# Patient Record
Sex: Male | Born: 2000
Health system: Southern US, Community
[De-identification: ages and names within clinical notes are randomized; demographics above are authoritative.]

---
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
## 2023-02-23 ENCOUNTER — Other Ambulatory Visit: Payer: Self-pay

## 2023-02-23 ENCOUNTER — Ambulatory Visit (INDEPENDENT_AMBULATORY_CARE_PROVIDER_SITE_OTHER): Payer: Self-pay | Admitting: Family Medicine

## 2023-02-23 ENCOUNTER — Encounter: Payer: Self-pay | Admitting: Family Medicine

## 2023-02-23 VITALS — BP 124/84 | Ht 71.0 in | Wt 190.0 lb

## 2023-02-23 DIAGNOSIS — S46911A Strain of unspecified muscle, fascia and tendon at shoulder and upper arm level, right arm, initial encounter: Secondary | ICD-10-CM

## 2023-02-23 DIAGNOSIS — M25511 Pain in right shoulder: Secondary | ICD-10-CM

## 2023-02-23 NOTE — Progress Notes (Addendum)
PCP: No primary care provider on file.  Chief Complaint: Right AC joint sprain Subjective:   HPI: Patient is a 22 y.o. male here for follow up after being diagnosed with [facility] joint sprain. Patient was skiing last week at [facility] out of state, patient states that he fell on his right shoulder and rolled down the hill.  Patient states that he subsequently had pain in his right shoulder.  Patient [place] and had an x-ray.  Patient states that he was told he had a grade 1or2 AC joint separation.  Patient was given a sling and has been wearing the sling ever since.  Patient notes that he has had significant improvement over the last few days of his pain as well as his range of motion.  Patient has no other concerns at this time.  History reviewed. No pertinent past medical history.  Current Outpatient Medications on File Prior to Visit  Medication Sig Dispense Refill   hyoscyamine (LEVSIN SL) 0.125 MG SL tablet 1-2 tablets under the tongue as needed for pain 30 tablet 0   minocycline (MINOCIN,DYNACIN) 100 MG capsule      No current facility-administered medications on file prior to visit.    History reviewed. No pertinent surgical history.  No Known Allergies  BP 124/84   Ht 5\' 11"  (1.803 m)   Wt 190 lb (86.2 kg)   BMI 26.50 kg/m       No data to display              No data to display              Objective:  Physical Exam:  Gen: NAD, comfortable in exam room  Right Shoulder: Inspection reveals some swelling of the AC joint on the right shoulder compared to the left.  There is no obvious abnormality/angulation or deformity of the [facility] joint noted.  There is tenderness to palpation over the [facility] joint itself, no other tenderness to palpation noted throughout the shoulder.  Range of motion is decreased but patient does have good internal and external rotation with some minimal abduction, patient cannot AB duct against resistance.  Strength is  decreased due to pain with abduction, strength is preserved with internal/external rotation.  U/S AC joint: Ultrasound of the AC joint does not show any significant effusion or geyser sign, there is some hypoechoic changes likely consistent with mild inflammation/fluid.  The acromion and the clavicular itself do not appear to be significantly displaced.  There does not appear to be any fractures of either the acromion or the clavicle at its distal either ends.  There appears to be some mild calcification in the joint itself but nothing significant.  Impression: AC joint with some mild hypoechoic changes consistent with some fluid, no signs of any fracture or significant displacement of the AC joint.   Assessment & Plan:  1. 1. Right shoulder pain, unspecified chronicity (Primary) - Patient was able to show [place] his x-rays via photo on his phone, initial x-rays do appear to show likely grade 2 AC joint strain.  Ultrasound also confirms this, there is no significant concern for greater than level 3 strain.  At this time patient is still wearing the sling but he does have increased range of motion as well as decreased pain, advised patient that he may remove the sling and we will go ahead and start getting him in physical therapy.  Patient was advised to try and do  2 sessions of physical therapy prior to going back to school, if any other concerns patient to follow-up with [place]. - [place] LIMITED JOINT SPACE STRUCTURES UP RIGHT; Future - Ambulatory referral to Physical Therapy  2. Strain of acromioclavicular joint, right, initial encounter See A/P for Rt shoulder pain above    Brenton Grills MD, PGY-4  Sports Medicine Fellow [facility]  Addendum:  Patient seen and examined in the office with fellow.   History, exam, plan of care were precepted with me.  Agree with findings as documented in fellow note.  Darene Lamer, DO, CAQSM

## 2023-03-09 ENCOUNTER — Encounter (HOSPITAL_BASED_OUTPATIENT_CLINIC_OR_DEPARTMENT_OTHER): Payer: Self-pay | Admitting: Physical Therapy

## 2023-03-09 ENCOUNTER — Other Ambulatory Visit: Payer: Self-pay

## 2023-03-09 ENCOUNTER — Ambulatory Visit (HOSPITAL_BASED_OUTPATIENT_CLINIC_OR_DEPARTMENT_OTHER): Payer: 59 | Attending: Family Medicine | Admitting: Physical Therapy

## 2023-03-09 DIAGNOSIS — M25511 Pain in right shoulder: Secondary | ICD-10-CM | POA: Diagnosis present

## 2023-03-09 NOTE — Therapy (Signed)
 OUTPATIENT PHYSICAL THERAPY UPPER EXTREMITY EVALUATION   Patient Name: Jorge Hernandez MRN: [id number] DOB:February 19, 2001, 23 y.o., male Today's Date: 03/09/2023  END OF SESSION:  PT End of Session - 03/09/23 1203     Visit Number 1    Number of Visits 4    Date for PT Re-Evaluation 04/06/23    PT Start Time 0845    PT Stop Time 0926    PT Time Calculation (min) 41 min    Activity Tolerance Patient tolerated treatment well    Behavior During Therapy [name] for tasks assessed/performed             History reviewed. No pertinent past medical history. History reviewed. No pertinent surgical history. There are no active problems to display for this patient.   PCP: None   REFERRING PROVIDER: Reyne Bustle MD   REFERRING DIAG: Diagnosis  M25.511 (ICD-10-CM) - Right shoulder pain, unspecified chronicity     THERAPY DIAG:  Acute pain of right shoulder  Rationale for Evaluation and Treatment: Rehabilitation  ONSET DATE:   SUBJECTIVE:                                                                                                                                                                                      SUBJECTIVE STATEMENT: Patient was skiing 2 weeks prior when he fell injuring his right shoulder.  He is found to have a AC joint sprain.  He reports a progressive improvement over the past 2 weeks.  He only feels pain at end ranges at this time.  He has no prior history of shoulder issues.  He was active and like to workout prior to his fall. Hand dominance: Right  PERTINENT HISTORY: No significant history.  PAIN:  Are you having pain? Yes: NPRS scale: 0/10 5-6/10  Pain location: right shoulder  Pain description: aching  Aggravating factors: use of the arm  Relieving factors: rest   PRECAUTIONS: None  RED FLAGS: None   WEIGHT BEARING RESTRICTIONS: No  FALLS:  Has patient fallen in last 6 months? No  LIVING ENVIRONMENT: Nothing pertinent   OCCUPATION: Student   Hobbies: lifts weights   PLOF: Independent  PATIENT GOALS:  To return to full function   NEXT MD VISIT:    OBJECTIVE:  Note: Objective measures were completed at Evaluation unless otherwise noted.  DIAGNOSTIC FINDINGS:    PATIENT SURVEYS :  FOTO    COGNITION: Overall cognitive status: Within functional limits for tasks assessed     SENSATION: Denies paresthesias   POSTURE: Good   UPPER EXTREMITY ROM:   Active ROM Right eval Left eval  Shoulder flexion Full pain free  Shoulder extension    Shoulder abduction    Shoulder adduction    Shoulder internal rotation Pain at end range    Shoulder external rotation Full pain free    Elbow flexion    Elbow extension    Wrist flexion    Wrist extension    Wrist ulnar deviation    Wrist radial deviation    Wrist pronation    Wrist supination    (Blank rows = not tested)  UPPER EXTREMITY MMT:  MMT Right eval Left eval  Shoulder flexion 12.4 16.4  Shoulder extension    Shoulder abduction    Shoulder adduction    Shoulder internal rotation 21.8 23.5  Shoulder external rotation 12.9 20.0  Middle trapezius    Lower trapezius    Elbow flexion    Elbow extension    Wrist flexion    Wrist extension    Wrist ulnar deviation    Wrist radial deviation    Wrist pronation    Wrist supination    Grip strength (lbs)    (Blank rows = not tested)    PALPATION:  No TTP at this time                                                                                                                             TREATMENT DATE:  Access Code: [email or web address] URL: [email or web address] Date: 03/09/2023 Prepared by: Alm Don  Exercises - Supine Shoulder Alphabet  - 1 x daily - 7 x weekly - 3 sets - 10 reps - Shoulder External Rotation with Anchored Resistance  - 1 x daily - 7 x weekly - 3 sets - 10 reps - Shoulder extension with resistance - Neutral  - 1 x daily - 7 x weekly - 3  sets - 10 reps - Scapular Retraction with Resistance  - 1 x daily - 7 x weekly - 3 sets - 10 reps - Standing Shoulder Internal Rotation with Anchored Resistance  - 1 x daily - 7 x weekly - 3 sets - 10 reps  PATIENT EDUCATION: Education details: HEP, symptom management, progression of exercises, use of RPE. Person educated: Patient Education method: Explanation, Demonstration, Tactile cues, Verbal cues, and Handouts Education comprehension: verbalized understanding, returned demonstration, verbal cues required, tactile cues required, and needs further education  HOME EXERCISE PROGRAM: Access Code: [email or web address] URL: [email or web address] Date: 03/09/2023 Prepared by: Alm Don  Exercises - Supine Shoulder Alphabet  - 1 x daily - 7 x weekly - 3 sets - 10 reps - Shoulder External Rotation with Anchored Resistance  - 1 x daily - 7 x weekly - 3 sets - 10 reps - Shoulder extension with resistance - Neutral  - 1 x daily - 7 x weekly - 3 sets - 10 reps - Scapular Retraction with Resistance  - 1 x daily - 7 x weekly - 3 sets - 10 reps - Standing Shoulder Internal Rotation with Anchored Resistance  - 1 x daily -  7 x weekly - 3 sets - 10 reps  ASSESSMENT:  CLINICAL IMPRESSION: Patient is a 23 year old male status post fall and subsequent right AC joint sprain approximately 2 weeks ago.  He presents with mild weakness and pain with endrange internal rotation.  He feels like he is been progressing over the past week or 2.  He returns to school on 1/13.  He hopes to return to weightlifting at some point.  He also rubs return to skiing.  He would benefit from skilled therapy to improve endrange IR and equalize strength left to right.  He is right-hand dominant.  Patient was given a progressive exercise program.  He was advised to follow the symptoms but progress as tolerated.  OBJECTIVE IMPAIRMENTS: decreased strength, impaired UE functional use, and pain.   ACTIVITY LIMITATIONS:  carrying, lifting, and reach over head  PARTICIPATION LIMITATIONS: cleaning, laundry, shopping, community activity, and exercise  PERSONAL FACTORS: None  REHAB POTENTIAL: Excellent  CLINICAL DECISION MAKING: Stable/uncomplicated  EVALUATION COMPLEXITY: Low  GOALS: Goals reviewed with patient? Yes    Short-term goals equal long-term goals   LONG TERM GOALS: Target date: 04/20/2023    Patient will demonstrate equal strength left to right in order to perform ADLs Baseline:  Goal status: INITIAL  2.  Patient will return to the gym without pain Baseline:  Goal status: INITIAL  3.  Patient will perform full internal rotation without pain in order to perform ADLs Baseline:  Goal status: INITIAL  4.  Patient will perform school tasks without increased right shoulder pain Baseline:  Goal status: INITIAL PLAN: PT FREQUENCY: 1x/week  PT DURATION: 4 weeks  PLANNED INTERVENTIONS: 97110-Therapeutic exercises, 97530- Therapeutic activity, [id number]- Neuromuscular re-education, 97535- Self Care, [id number]- Manual therapy, [id number]- Electrical stimulation (unattended), 97035- Ultrasound, Patient/Family education,  Taping, Dry Needling, DME instructions, Cryotherapy, and Moist heat   PLAN FOR NEXT SESSION: Assess tolerance to exercises.  If patient does well advance exercises using RPE for guide.  Consider moving back into light gym exercises such as rows LAT pull downs and bicep curls.  Avoid bench pressing and anything behind the head at this point.  If the patient is sore consider manual therapy.   Alm JINNY Don, PT 03/09/2023, 12:09 PM

## 2023-03-14 ENCOUNTER — Encounter (HOSPITAL_BASED_OUTPATIENT_CLINIC_OR_DEPARTMENT_OTHER): Payer: Self-pay | Admitting: Physical Therapy

## 2023-03-14 ENCOUNTER — Ambulatory Visit (HOSPITAL_BASED_OUTPATIENT_CLINIC_OR_DEPARTMENT_OTHER): Payer: 59 | Admitting: Physical Therapy

## 2023-03-14 DIAGNOSIS — M25511 Pain in right shoulder: Secondary | ICD-10-CM

## 2023-03-14 NOTE — Therapy (Signed)
 OUTPATIENT PHYSICAL THERAPY UPPER EXTREMITY EVALUATION   Patient Name: Jorge Hernandez MRN: [id number] DOB:2001/01/08, 23 y.o., male Today's Date: 03/14/2023  END OF SESSION:  PT End of Session - 03/14/23 1305     Visit Number 2    Number of Visits 4    Date for PT Re-Evaluation 04/20/23    PT Start Time 1300    PT Stop Time 1343    PT Time Calculation (min) 43 min    Activity Tolerance Patient tolerated treatment well    Behavior During Therapy [facility] for tasks assessed/performed             History reviewed. No pertinent past medical history. History reviewed. No pertinent surgical history. There are no active problems to display for this patient.   PCP: None   REFERRING PROVIDER: Reyne Bustle MD   REFERRING DIAG: Diagnosis  M25.511 (ICD-10-CM) - Right shoulder pain, unspecified chronicity     THERAPY DIAG:  Acute pain of right shoulder  Rationale for Evaluation and Treatment: Rehabilitation  ONSET DATE:   SUBJECTIVE:                                                                                                                                                                                      SUBJECTIVE STATEMENT: The patient reported no significant pain after his last visit. There exercises have been going well.   Eval: Patient was skiing 2 weeks prior when he fell injuring his right shoulder.  He is found to have a AC joint sprain.  He reports a progressive improvement over the past 2 weeks.  He only feels pain at end ranges at this time.  He has no prior history of shoulder issues.  He was active and like to workout prior to his fall. Hand dominance: Right  PERTINENT HISTORY: No significant history.  PAIN:  Are you having pain? Yes: NPRS scale: 0/10 5-6/10  Pain location: right shoulder  Pain description: aching  Aggravating factors: use of the arm  Relieving factors: rest   PRECAUTIONS: None  RED FLAGS: None   WEIGHT BEARING RESTRICTIONS:  No  FALLS:  Has patient fallen in last 6 months? No  LIVING ENVIRONMENT: Nothing pertinent  OCCUPATION: Student   Hobbies: lifts weights   PLOF: Independent  PATIENT GOALS:  To return to full function   NEXT MD VISIT:    OBJECTIVE:  Note: Objective measures were completed at Evaluation unless otherwise noted.  DIAGNOSTIC FINDINGS:    PATIENT SURVEYS :  FOTO    COGNITION: Overall cognitive status: Within functional limits for tasks assessed     SENSATION: Denies paresthesias   POSTURE: Good  UPPER EXTREMITY ROM:   Active ROM Right eval Left eval  Shoulder flexion Full pain free    Shoulder extension    Shoulder abduction    Shoulder adduction    Shoulder internal rotation Pain at end range    Shoulder external rotation Full pain free    Elbow flexion    Elbow extension    Wrist flexion    Wrist extension    Wrist ulnar deviation    Wrist radial deviation    Wrist pronation    Wrist supination    (Blank rows = not tested)  UPPER EXTREMITY MMT:  MMT Right eval Left eval  Shoulder flexion 12.4 16.4  Shoulder extension    Shoulder abduction    Shoulder adduction    Shoulder internal rotation 21.8 23.5  Shoulder external rotation 12.9 20.0  Middle trapezius    Lower trapezius    Elbow flexion    Elbow extension    Wrist flexion    Wrist extension    Wrist ulnar deviation    Wrist radial deviation    Wrist pronation    Wrist supination    Grip strength (lbs)    (Blank rows = not tested)    PALPATION:  No TTP at this time                                                                                                                             TREATMENT DATE:  1/8   Wand flexion for stretch x15 wand  Supine ABC 3 lbs   Standing scaption 2lbs 2x10  Standing Flexion 2x10 2 lbs  Both in mirror   Cable:  Row 2x12 15 lbs  Lat pull down 2x12 15 lbs  Tricpes split rope 2x12 15 lbs  Bicpes curl 2x12 8 lbs        Eval: Access Code: [email or web address] URL: [email or web address] Date: 03/09/2023 Prepared by: Alm Don  Exercises - Supine Shoulder Alphabet  - 1 x daily - 7 x weekly - 3 sets - 10 reps - Shoulder External Rotation with Anchored Resistance  - 1 x daily - 7 x weekly - 3 sets - 10 reps - Shoulder extension with resistance - Neutral  - 1 x daily - 7 x weekly - 3 sets - 10 reps - Scapular Retraction with Resistance  - 1 x daily - 7 x weekly - 3 sets - 10 reps - Standing Shoulder Internal Rotation with Anchored Resistance  - 1 x daily - 7 x weekly - 3 sets - 10 reps  PATIENT EDUCATION: Education details: HEP, symptom management, progression of exercises, use of RPE. Person educated: Patient Education method: Explanation, Demonstration, Tactile cues, Verbal cues, and Handouts Education comprehension: verbalized understanding, returned demonstration, verbal cues required, tactile cues required, and needs further education  HOME EXERCISE PROGRAM: Access Code: [email or web address] URL: [email or web address] Date: 03/09/2023 Prepared by: Alm Don  Exercises - Supine Shoulder Alphabet  - 1 x daily - 7  x weekly - 3 sets - 10 reps - Shoulder External Rotation with Anchored Resistance  - 1 x daily - 7 x weekly - 3 sets - 10 reps - Shoulder extension with resistance - Neutral  - 1 x daily - 7 x weekly - 3 sets - 10 reps - Scapular Retraction with Resistance  - 1 x daily - 7 x weekly - 3 sets - 10 reps - Standing Shoulder Internal Rotation with Anchored Resistance  - 1 x daily - 7 x weekly - 3 sets - 10 reps  ASSESSMENT:  CLINICAL IMPRESSION: Therapy updated patients HEP. He was given standing exercises to work on. We updated his HEP We reviewed how to use RPE to grade his exercises. He  was advised to progress his weights as tolerated. He had no pain. He will be going back to school. We will likely D/C to HEP. We also reviewed progression back into benching but he was  advised to give it a few weeks.  Eval: Patient is a 23 year old male status post fall and subsequent right AC joint sprain approximately 2 weeks ago.  He presents with mild weakness and pain with endrange internal rotation.  He feels like he is been progressing over the past week or 2.  He returns to school on 1/13.  He hopes to return to weightlifting at some point.  He also rubs return to skiing.  He would benefit from skilled therapy to improve endrange IR and equalize strength left to right.  He is right-hand dominant.  Patient was given a progressive exercise program.  He was advised to follow the symptoms but progress as tolerated.  OBJECTIVE IMPAIRMENTS: decreased strength, impaired UE functional use, and pain.   ACTIVITY LIMITATIONS: carrying, lifting, and reach over head  PARTICIPATION LIMITATIONS: cleaning, laundry, shopping, community activity, and exercise  PERSONAL FACTORS: None  REHAB POTENTIAL: Excellent  CLINICAL DECISION MAKING: Stable/uncomplicated  EVALUATION COMPLEXITY: Low  GOALS: Goals reviewed with patient? Yes    Short-term goals equal long-term goals   LONG TERM GOALS: Target date: 04/20/2023    Patient will demonstrate equal strength left to right in order to perform ADLs Baseline:  Goal status: INITIAL  2.  Patient will return to the gym without pain Baseline:  Goal status: INITIAL  3.  Patient will perform full internal rotation without pain in order to perform ADLs Baseline:  Goal status: INITIAL  4.  Patient will perform school tasks without increased right shoulder pain Baseline:  Goal status: INITIAL PLAN: PT FREQUENCY: 1x/week  PT DURATION: 4 weeks  PLANNED INTERVENTIONS: 97110-Therapeutic exercises, 97530- Therapeutic activity, [id number]- Neuromuscular re-education, 97535- Self Care, [id number]- Manual therapy, [id number]- Electrical stimulation (unattended), [id number]- Ultrasound, Patient/Family education,  Taping, Dry Needling, DME instructions,  Cryotherapy, and Moist heat   PLAN FOR NEXT SESSION: Assess tolerance to exercises.  If patient does well advance exercises using RPE for guide.  Consider moving back into light gym exercises such as rows LAT pull downs and bicep curls.  Avoid bench pressing and anything behind the head at this point.  If the patient is sore consider manual therapy.   Alm JINNY Don, PT 03/14/2023, 1:35 PM
